# Patient Record
Sex: Female | Born: 1955 | Race: Black or African American | Hispanic: No | Marital: Single | Smoking: Never smoker
Health system: Southern US, Community
[De-identification: ages and names within clinical notes are randomized; demographics above are authoritative.]

## PROBLEM LIST (undated history)

## (undated) DIAGNOSIS — I10 Essential (primary) hypertension: Secondary | ICD-10-CM

## (undated) DIAGNOSIS — H269 Unspecified cataract: Secondary | ICD-10-CM

## (undated) HISTORY — PX: ABDOMINAL HYSTERECTOMY: SHX81

## (undated) HISTORY — DX: Essential (primary) hypertension: I10

## (undated) HISTORY — DX: Unspecified cataract: H26.9

---

## 1999-01-28 ENCOUNTER — Encounter: Payer: Self-pay | Admitting: Gastroenterology

## 1999-01-28 ENCOUNTER — Ambulatory Visit (HOSPITAL_COMMUNITY): Admission: RE | Admit: 1999-01-28 | Discharge: 1999-01-28 | Payer: Self-pay | Admitting: Gastroenterology

## 2001-06-02 ENCOUNTER — Other Ambulatory Visit: Admission: RE | Admit: 2001-06-02 | Discharge: 2001-06-02 | Payer: Self-pay | Admitting: Obstetrics and Gynecology

## 2003-08-22 ENCOUNTER — Other Ambulatory Visit: Admission: RE | Admit: 2003-08-22 | Discharge: 2003-08-22 | Payer: Self-pay | Admitting: Obstetrics and Gynecology

## 2005-01-06 ENCOUNTER — Ambulatory Visit: Payer: Self-pay | Admitting: Gastroenterology

## 2006-11-16 ENCOUNTER — Ambulatory Visit: Payer: Self-pay | Admitting: Gastroenterology

## 2006-11-30 ENCOUNTER — Ambulatory Visit: Payer: Self-pay | Admitting: Gastroenterology

## 2009-11-29 ENCOUNTER — Encounter: Admission: RE | Admit: 2009-11-29 | Discharge: 2009-11-29 | Payer: Self-pay | Admitting: Obstetrics and Gynecology

## 2010-10-12 IMAGING — US US SOFT TISSUE HEAD/NECK
1 series · 14 of 25 positions shown · non-contrast
Comparison: None.

CLINICAL DATA: Question of left thyroid nodule on physical exam

THYROID ULTRASOUND
TECHNIQUE: Ultrasound examination of the thyroid gland and
adjacent soft tissues was performed.

[Series 1: us soft tissue head/neck · 0.10mm/px · 14 of 39 slices shown]
[im 1/39]
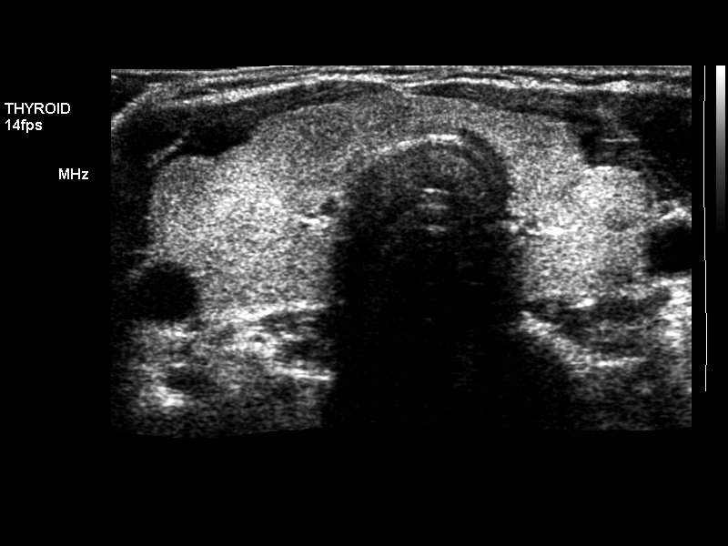
[im 4/39]
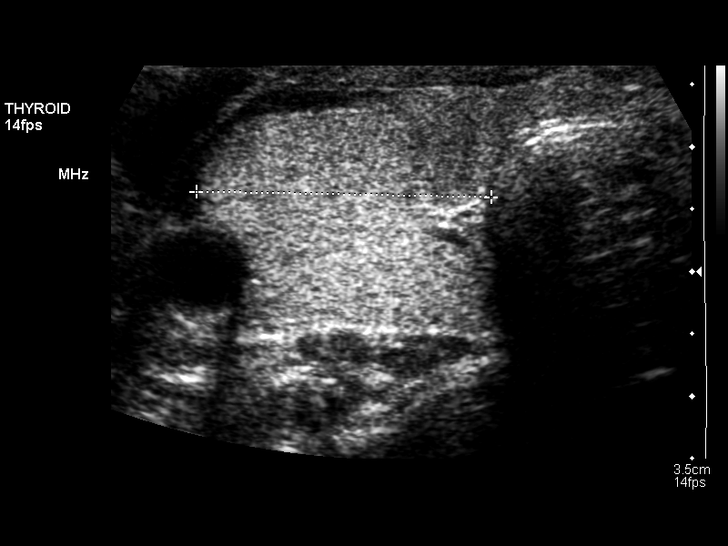
[im 7/39]
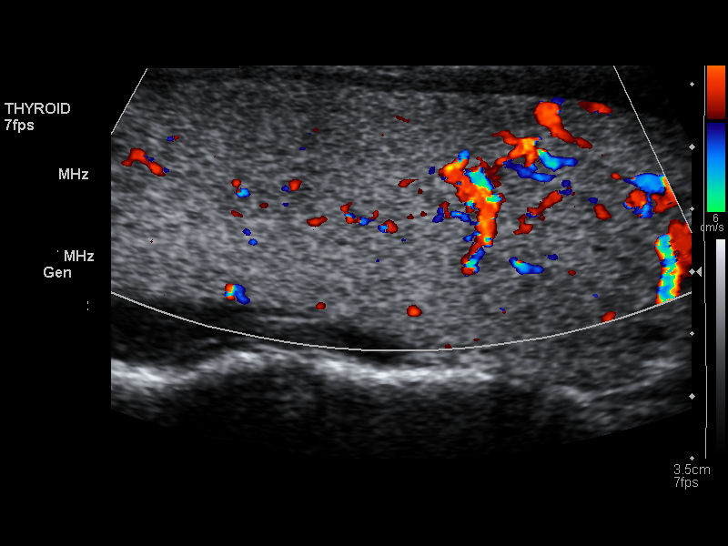
[im 10/39]
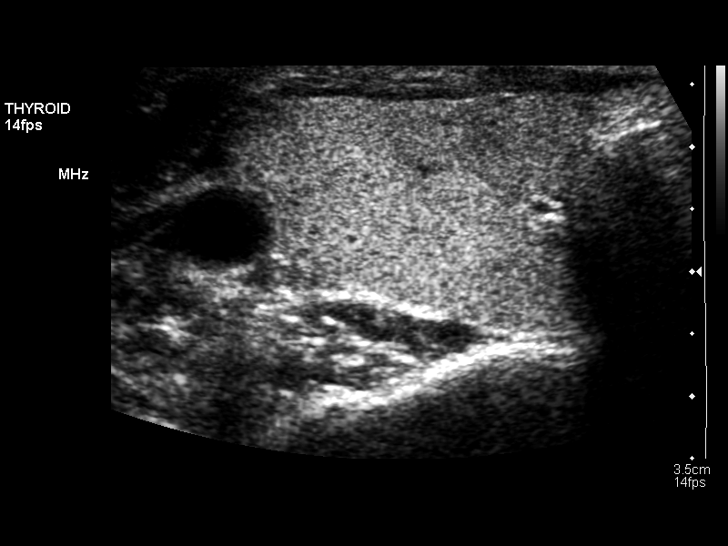
[im 13/39]
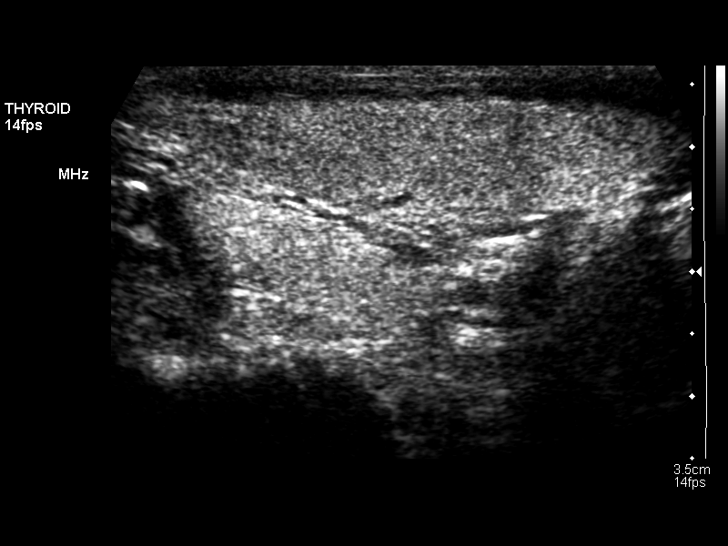
[im 15/39]
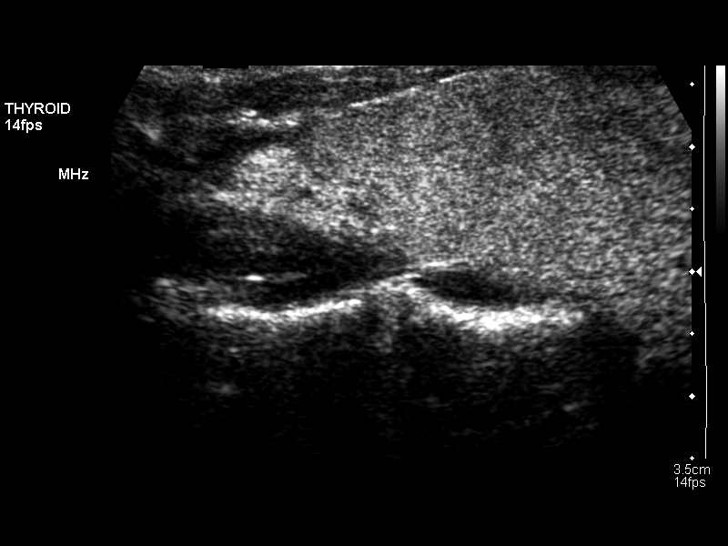
[im 18/39]
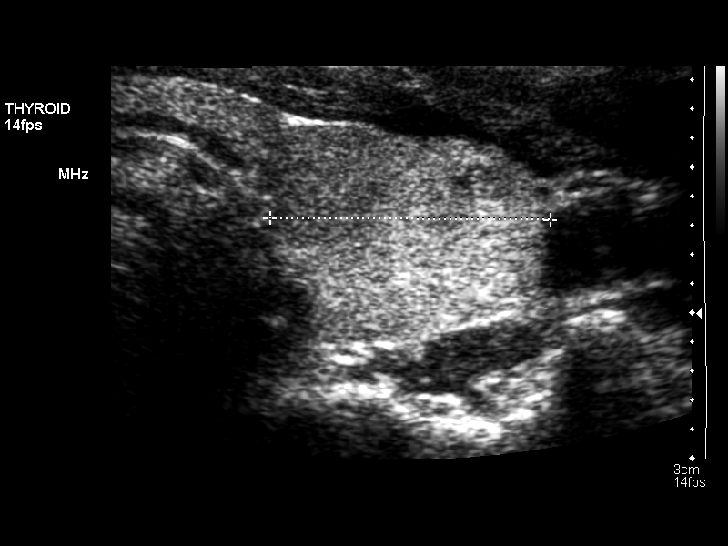
[im 21/39]
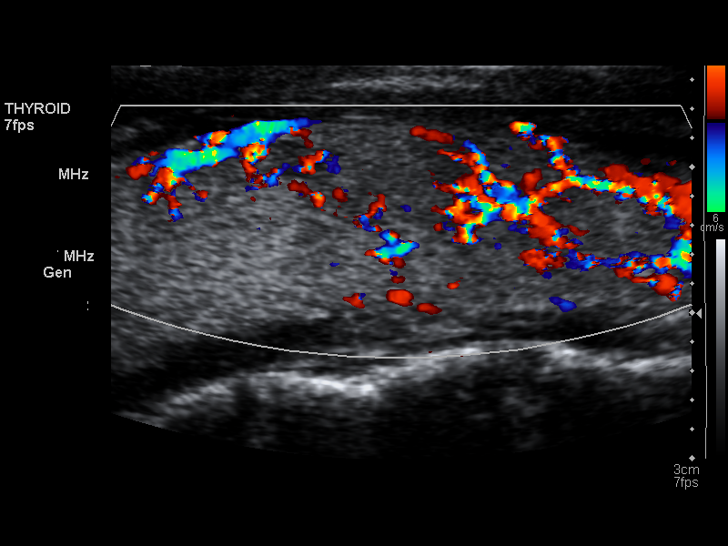
[im 24/39]
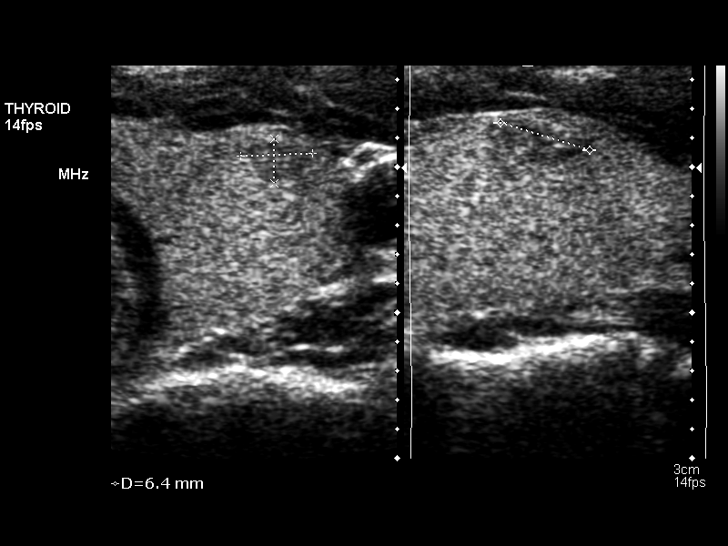
[im 26/39]
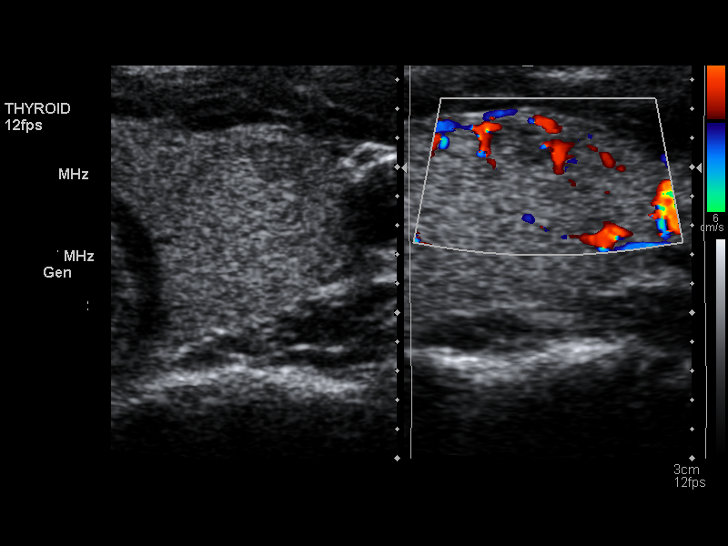
[im 29/39]
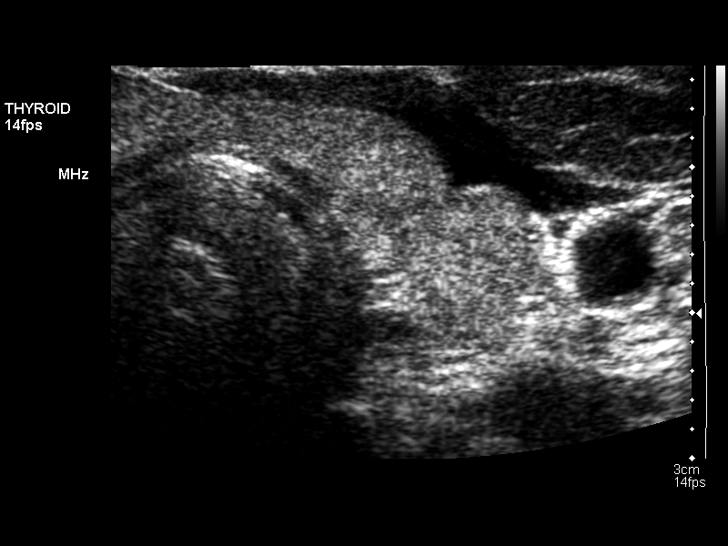
[im 32/39]
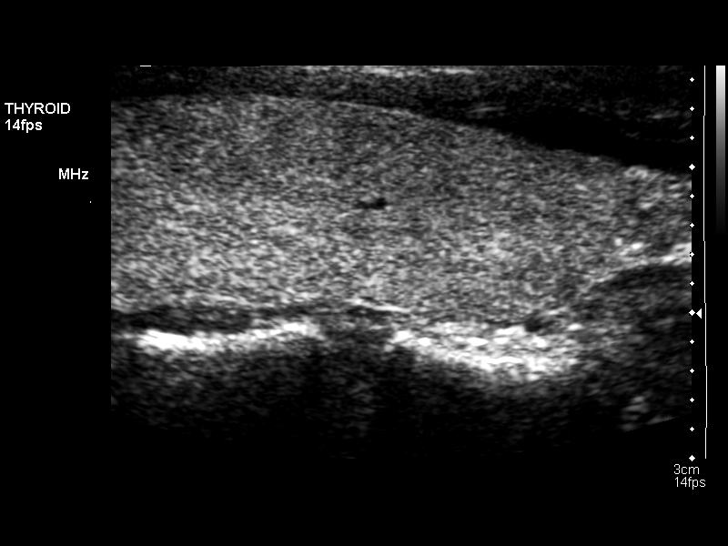
[im 35/39]
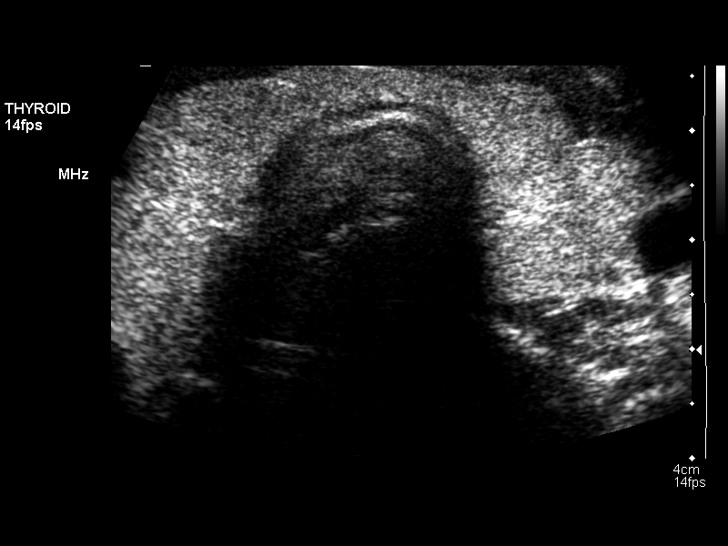
[im 39/39]
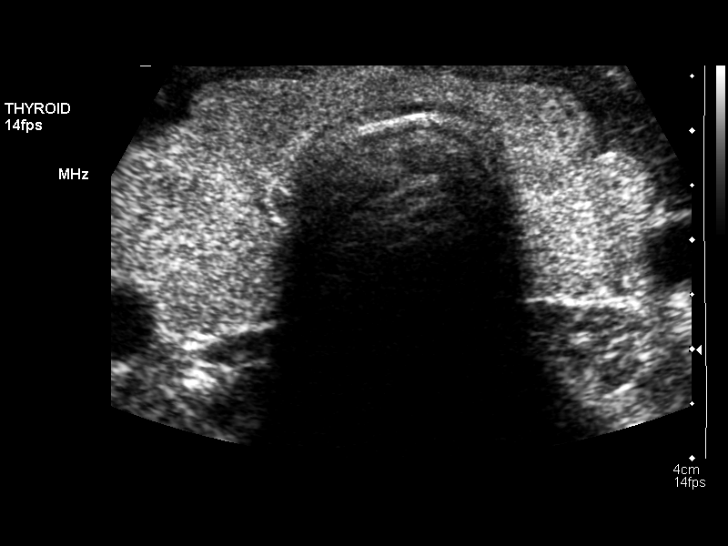

[14 of 25 positions shown; findings below may reference images not displayed]

FINDINGS: The thyroid gland is somewhat elongate and within upper
limits of normal in size.  The right lobe measures 6.8 cm
sagittally, with a depth of 1.9 cm and width of 2.4 cm.  The left
lobe measures 6.8 x 1.6 x 1.9 cm, with the isthmus measuring
mm.  The gland is only slightly inhomogeneous.  Only a single small
solid nodule is noted on the left of 5 x 3 x 6 mm.
IMPRESSION: The thyroid gland is slightly prominent and somewhat inhomogeneous.
Only a single solid nodule on the left is noted of no more than 6
mm in diameter.

## 2010-11-25 NOTE — Assessment & Plan Note (Signed)
[facility]                         GASTROENTEROLOGY OFFICE NOTE   Rebecca Pineda, Rebecca Pineda                    MRN:          [id number]  DATE:11/16/2006                            DOB:          1955/12/05    PROBLEM:  Excess gas.   REASON:  Rebecca Pineda has returned for reevaluation.  She complains of  excess flatus and eructations__________  post prandially.  She has also  had some left upper quadrant discomfort which is slightly relieved by a  bowel movement.  There has been no change of bowel habits, per se.  She  denies melena or hematochezia.  She also denies pyrosis and dysphagia.  She has a history of an esophageal stricture.   The only medication is Premarin.   PHYSICAL EXAMINATION:  Pulse 80, blood pressure 110/78, weight 136.  HEENT: EOMI. PERRLA. Sclerae are anicteric.  Conjunctivae are pink.  NECK:  Supple without thyromegaly, adenopathy or carotid bruits.  CHEST:  Clear to auscultation and percussion without adventitious  sounds.  CARDIAC:  Regular rhythm; normal S1 S2.  There are no murmurs, gallops  or rubs.  ABDOMEN:  Bowel sounds are normoactive.  Abdomen is soft, non-tender and  non-distended.  There are no abdominal masses, tenderness, splenic  enlargement or hepatomegaly.  EXTREMITIES:  Full range of motion.  No cyanosis, clubbing or edema.  RECTAL:  Deferred.   IMPRESSION:  Excess ____eructations______ and flatus.  This may be a  manifestation of gastroesophageal reflux disease.  At the same time, she  has had some left lower quadrant discomfort.  A partial obstruction  lesion of the colon is less likely, although ought to be ruled out.   RECOMMENDATION:  1. Trial of Nexium 40 mg daily.  2. Colonoscopy.     Barbette Hair. Arlyce Dice, MD,FACG  Electronically Signed    RDK/MedQ  DD: 11/16/2006  DT: 11/16/2006  Job #: [id number]   cc:   Nolon Nations, MD

## 2015-12-23 DIAGNOSIS — I519 Heart disease, unspecified: Secondary | ICD-10-CM | POA: Diagnosis not present

## 2015-12-23 DIAGNOSIS — E119 Type 2 diabetes mellitus without complications: Secondary | ICD-10-CM | POA: Diagnosis not present

## 2015-12-23 DIAGNOSIS — E559 Vitamin D deficiency, unspecified: Secondary | ICD-10-CM | POA: Diagnosis not present

## 2015-12-23 DIAGNOSIS — R946 Abnormal results of thyroid function studies: Secondary | ICD-10-CM | POA: Diagnosis not present

## 2016-01-27 DIAGNOSIS — Z6822 Body mass index (BMI) 22.0-22.9, adult: Secondary | ICD-10-CM | POA: Diagnosis not present

## 2016-01-27 DIAGNOSIS — Z01419 Encounter for gynecological examination (general) (routine) without abnormal findings: Secondary | ICD-10-CM | POA: Diagnosis not present

## 2016-02-06 DIAGNOSIS — Z1231 Encounter for screening mammogram for malignant neoplasm of breast: Secondary | ICD-10-CM | POA: Diagnosis not present

## 2016-09-17 DIAGNOSIS — E119 Type 2 diabetes mellitus without complications: Secondary | ICD-10-CM | POA: Diagnosis not present

## 2016-09-17 DIAGNOSIS — E559 Vitamin D deficiency, unspecified: Secondary | ICD-10-CM | POA: Diagnosis not present

## 2016-09-17 DIAGNOSIS — K2970 Gastritis, unspecified, without bleeding: Secondary | ICD-10-CM | POA: Diagnosis not present

## 2016-09-17 DIAGNOSIS — I519 Heart disease, unspecified: Secondary | ICD-10-CM | POA: Diagnosis not present

## 2016-10-08 ENCOUNTER — Encounter: Payer: Self-pay | Admitting: Gastroenterology

## 2016-11-03 DIAGNOSIS — I519 Heart disease, unspecified: Secondary | ICD-10-CM | POA: Diagnosis not present

## 2016-11-03 DIAGNOSIS — E559 Vitamin D deficiency, unspecified: Secondary | ICD-10-CM | POA: Diagnosis not present

## 2016-11-03 DIAGNOSIS — E119 Type 2 diabetes mellitus without complications: Secondary | ICD-10-CM | POA: Diagnosis not present

## 2016-11-03 DIAGNOSIS — Z01118 Encounter for examination of ears and hearing with other abnormal findings: Secondary | ICD-10-CM | POA: Diagnosis not present

## 2016-11-03 DIAGNOSIS — H538 Other visual disturbances: Secondary | ICD-10-CM | POA: Diagnosis not present

## 2016-11-03 DIAGNOSIS — Z136 Encounter for screening for cardiovascular disorders: Secondary | ICD-10-CM | POA: Diagnosis not present

## 2016-11-03 DIAGNOSIS — Z0000 Encounter for general adult medical examination without abnormal findings: Secondary | ICD-10-CM | POA: Diagnosis not present

## 2016-11-10 DIAGNOSIS — Z1211 Encounter for screening for malignant neoplasm of colon: Secondary | ICD-10-CM | POA: Diagnosis not present

## 2016-12-03 DIAGNOSIS — Z1211 Encounter for screening for malignant neoplasm of colon: Secondary | ICD-10-CM | POA: Diagnosis not present

## 2016-12-15 DIAGNOSIS — A048 Other specified bacterial intestinal infections: Secondary | ICD-10-CM | POA: Diagnosis not present

## 2016-12-15 DIAGNOSIS — K219 Gastro-esophageal reflux disease without esophagitis: Secondary | ICD-10-CM | POA: Diagnosis not present

## 2016-12-15 DIAGNOSIS — Z1211 Encounter for screening for malignant neoplasm of colon: Secondary | ICD-10-CM | POA: Diagnosis not present

## 2016-12-18 DIAGNOSIS — K5281 Eosinophilic gastritis or gastroenteritis: Secondary | ICD-10-CM | POA: Diagnosis not present

## 2016-12-18 DIAGNOSIS — K2970 Gastritis, unspecified, without bleeding: Secondary | ICD-10-CM | POA: Diagnosis not present

## 2016-12-18 DIAGNOSIS — K317 Polyp of stomach and duodenum: Secondary | ICD-10-CM | POA: Diagnosis not present

## 2016-12-18 DIAGNOSIS — K2270 Barrett's esophagus without dysplasia: Secondary | ICD-10-CM | POA: Diagnosis not present

## 2016-12-21 DIAGNOSIS — K209 Esophagitis, unspecified: Secondary | ICD-10-CM | POA: Diagnosis not present

## 2016-12-29 DIAGNOSIS — K219 Gastro-esophageal reflux disease without esophagitis: Secondary | ICD-10-CM | POA: Diagnosis not present

## 2017-02-02 DIAGNOSIS — E119 Type 2 diabetes mellitus without complications: Secondary | ICD-10-CM | POA: Diagnosis not present

## 2017-02-02 DIAGNOSIS — E559 Vitamin D deficiency, unspecified: Secondary | ICD-10-CM | POA: Diagnosis not present

## 2017-02-02 DIAGNOSIS — I519 Heart disease, unspecified: Secondary | ICD-10-CM | POA: Diagnosis not present

## 2017-02-12 DIAGNOSIS — Z6823 Body mass index (BMI) 23.0-23.9, adult: Secondary | ICD-10-CM | POA: Diagnosis not present

## 2017-02-12 DIAGNOSIS — Z01419 Encounter for gynecological examination (general) (routine) without abnormal findings: Secondary | ICD-10-CM | POA: Diagnosis not present

## 2017-02-12 DIAGNOSIS — Z124 Encounter for screening for malignant neoplasm of cervix: Secondary | ICD-10-CM | POA: Diagnosis not present

## 2017-02-12 DIAGNOSIS — Z1231 Encounter for screening mammogram for malignant neoplasm of breast: Secondary | ICD-10-CM | POA: Diagnosis not present

## 2017-05-03 DIAGNOSIS — E559 Vitamin D deficiency, unspecified: Secondary | ICD-10-CM | POA: Diagnosis not present

## 2017-05-03 DIAGNOSIS — I519 Heart disease, unspecified: Secondary | ICD-10-CM | POA: Diagnosis not present

## 2017-05-03 DIAGNOSIS — E119 Type 2 diabetes mellitus without complications: Secondary | ICD-10-CM | POA: Diagnosis not present

## 2017-05-03 DIAGNOSIS — Z2821 Immunization not carried out because of patient refusal: Secondary | ICD-10-CM | POA: Diagnosis not present

## 2017-09-20 DIAGNOSIS — K5900 Constipation, unspecified: Secondary | ICD-10-CM | POA: Diagnosis not present

## 2017-09-20 DIAGNOSIS — I519 Heart disease, unspecified: Secondary | ICD-10-CM | POA: Diagnosis not present

## 2017-09-20 DIAGNOSIS — E559 Vitamin D deficiency, unspecified: Secondary | ICD-10-CM | POA: Diagnosis not present

## 2017-09-20 DIAGNOSIS — E119 Type 2 diabetes mellitus without complications: Secondary | ICD-10-CM | POA: Diagnosis not present

## 2017-10-18 DIAGNOSIS — Z01118 Encounter for examination of ears and hearing with other abnormal findings: Secondary | ICD-10-CM | POA: Diagnosis not present

## 2017-10-18 DIAGNOSIS — E119 Type 2 diabetes mellitus without complications: Secondary | ICD-10-CM | POA: Diagnosis not present

## 2017-10-18 DIAGNOSIS — Z0000 Encounter for general adult medical examination without abnormal findings: Secondary | ICD-10-CM | POA: Diagnosis not present

## 2017-10-18 DIAGNOSIS — K5900 Constipation, unspecified: Secondary | ICD-10-CM | POA: Diagnosis not present

## 2017-10-18 DIAGNOSIS — E559 Vitamin D deficiency, unspecified: Secondary | ICD-10-CM | POA: Diagnosis not present

## 2017-10-18 DIAGNOSIS — I519 Heart disease, unspecified: Secondary | ICD-10-CM | POA: Diagnosis not present

## 2018-03-03 DIAGNOSIS — Z01419 Encounter for gynecological examination (general) (routine) without abnormal findings: Secondary | ICD-10-CM | POA: Diagnosis not present

## 2018-03-03 DIAGNOSIS — Z1231 Encounter for screening mammogram for malignant neoplasm of breast: Secondary | ICD-10-CM | POA: Diagnosis not present

## 2018-03-03 DIAGNOSIS — Z6824 Body mass index (BMI) 24.0-24.9, adult: Secondary | ICD-10-CM | POA: Diagnosis not present

## 2018-05-16 DIAGNOSIS — I519 Heart disease, unspecified: Secondary | ICD-10-CM | POA: Diagnosis not present

## 2018-05-16 DIAGNOSIS — E559 Vitamin D deficiency, unspecified: Secondary | ICD-10-CM | POA: Diagnosis not present

## 2018-05-16 DIAGNOSIS — K5900 Constipation, unspecified: Secondary | ICD-10-CM | POA: Diagnosis not present

## 2018-05-16 DIAGNOSIS — E119 Type 2 diabetes mellitus without complications: Secondary | ICD-10-CM | POA: Diagnosis not present

## 2018-06-14 DIAGNOSIS — I519 Heart disease, unspecified: Secondary | ICD-10-CM | POA: Diagnosis not present

## 2018-06-14 DIAGNOSIS — E119 Type 2 diabetes mellitus without complications: Secondary | ICD-10-CM | POA: Diagnosis not present

## 2018-06-14 DIAGNOSIS — E559 Vitamin D deficiency, unspecified: Secondary | ICD-10-CM | POA: Diagnosis not present

## 2018-06-14 DIAGNOSIS — K5900 Constipation, unspecified: Secondary | ICD-10-CM | POA: Diagnosis not present

## 2018-06-28 DIAGNOSIS — E119 Type 2 diabetes mellitus without complications: Secondary | ICD-10-CM | POA: Diagnosis not present

## 2018-06-28 DIAGNOSIS — E559 Vitamin D deficiency, unspecified: Secondary | ICD-10-CM | POA: Diagnosis not present

## 2018-06-28 DIAGNOSIS — K5900 Constipation, unspecified: Secondary | ICD-10-CM | POA: Diagnosis not present

## 2018-06-28 DIAGNOSIS — I519 Heart disease, unspecified: Secondary | ICD-10-CM | POA: Diagnosis not present

## 2019-06-30 DIAGNOSIS — Z01419 Encounter for gynecological examination (general) (routine) without abnormal findings: Secondary | ICD-10-CM | POA: Diagnosis not present

## 2019-06-30 DIAGNOSIS — Z6824 Body mass index (BMI) 24.0-24.9, adult: Secondary | ICD-10-CM | POA: Diagnosis not present

## 2019-06-30 DIAGNOSIS — Z1231 Encounter for screening mammogram for malignant neoplasm of breast: Secondary | ICD-10-CM | POA: Diagnosis not present

## 2019-12-18 ENCOUNTER — Other Ambulatory Visit: Payer: Self-pay | Admitting: Internal Medicine

## 2019-12-21 ENCOUNTER — Other Ambulatory Visit: Payer: Self-pay | Admitting: Internal Medicine

## 2019-12-21 DIAGNOSIS — E559 Vitamin D deficiency, unspecified: Secondary | ICD-10-CM

## 2019-12-21 DIAGNOSIS — E139 Other specified diabetes mellitus without complications: Secondary | ICD-10-CM

## 2020-01-16 ENCOUNTER — Ambulatory Visit
Admission: RE | Admit: 2020-01-16 | Discharge: 2020-01-16 | Disposition: A | Discharge status: Home / Self Care | Payer: 59 | Source: Ambulatory Visit | Attending: Internal Medicine | Admitting: Internal Medicine

## 2020-01-16 ENCOUNTER — Other Ambulatory Visit: Payer: Self-pay

## 2020-01-16 DIAGNOSIS — E559 Vitamin D deficiency, unspecified: Secondary | ICD-10-CM

## 2020-01-16 DIAGNOSIS — E139 Other specified diabetes mellitus without complications: Secondary | ICD-10-CM

## 2021-10-20 DIAGNOSIS — I10 Essential (primary) hypertension: Secondary | ICD-10-CM | POA: Diagnosis not present

## 2021-10-20 DIAGNOSIS — E559 Vitamin D deficiency, unspecified: Secondary | ICD-10-CM | POA: Diagnosis not present

## 2021-10-20 DIAGNOSIS — E119 Type 2 diabetes mellitus without complications: Secondary | ICD-10-CM | POA: Diagnosis not present

## 2021-10-20 DIAGNOSIS — K5904 Chronic idiopathic constipation: Secondary | ICD-10-CM | POA: Diagnosis not present

## 2021-10-20 DIAGNOSIS — E782 Mixed hyperlipidemia: Secondary | ICD-10-CM | POA: Diagnosis not present

## 2021-10-20 DIAGNOSIS — R1084 Generalized abdominal pain: Secondary | ICD-10-CM | POA: Diagnosis not present

## 2021-11-04 DIAGNOSIS — H524 Presbyopia: Secondary | ICD-10-CM | POA: Diagnosis not present

## 2021-11-04 DIAGNOSIS — H5203 Hypermetropia, bilateral: Secondary | ICD-10-CM | POA: Diagnosis not present

## 2021-11-04 DIAGNOSIS — H52203 Unspecified astigmatism, bilateral: Secondary | ICD-10-CM | POA: Diagnosis not present

## 2021-11-04 DIAGNOSIS — H2513 Age-related nuclear cataract, bilateral: Secondary | ICD-10-CM | POA: Diagnosis not present

## 2021-11-04 DIAGNOSIS — H47393 Other disorders of optic disc, bilateral: Secondary | ICD-10-CM | POA: Diagnosis not present

## 2021-11-04 DIAGNOSIS — H40003 Preglaucoma, unspecified, bilateral: Secondary | ICD-10-CM | POA: Diagnosis not present

## 2021-11-07 ENCOUNTER — Other Ambulatory Visit: Payer: Self-pay

## 2021-11-07 DIAGNOSIS — I10 Essential (primary) hypertension: Secondary | ICD-10-CM | POA: Insufficient documentation

## 2021-11-07 DIAGNOSIS — Z780 Asymptomatic menopausal state: Secondary | ICD-10-CM | POA: Insufficient documentation

## 2021-11-10 ENCOUNTER — Encounter: Payer: Self-pay | Admitting: Nurse Practitioner

## 2021-11-10 ENCOUNTER — Other Ambulatory Visit (INDEPENDENT_AMBULATORY_CARE_PROVIDER_SITE_OTHER): Payer: Medicare Other

## 2021-11-10 ENCOUNTER — Ambulatory Visit: Payer: Medicare Other | Admitting: Nurse Practitioner

## 2021-11-10 VITALS — BP 120/70 | HR 83 | Ht 64.0 in | Wt 136.5 lb

## 2021-11-10 DIAGNOSIS — R1032 Left lower quadrant pain: Secondary | ICD-10-CM

## 2021-11-10 DIAGNOSIS — K5900 Constipation, unspecified: Secondary | ICD-10-CM

## 2021-11-10 LAB — CBC WITH DIFFERENTIAL/PLATELET
Basophils Absolute: 0 10*3/uL (ref 0.0–0.1)
Basophils Relative: 1.2 % (ref 0.0–3.0)
Eosinophils Absolute: 0.1 10*3/uL (ref 0.0–0.7)
Eosinophils Relative: 1.8 % (ref 0.0–5.0)
HCT: 40.2 % (ref 36.0–46.0)
Hemoglobin: 13.1 g/dL (ref 12.0–15.0)
Lymphocytes Relative: 31.5 % (ref 12.0–46.0)
Lymphs Abs: 1.1 10*3/uL (ref 0.7–4.0)
MCHC: 32.7 g/dL (ref 30.0–36.0)
MCV: 85.4 fl (ref 78.0–100.0)
Monocytes Absolute: 0.2 10*3/uL (ref 0.1–1.0)
Monocytes Relative: 6.4 % (ref 3.0–12.0)
Neutro Abs: 2.1 10*3/uL (ref 1.4–7.7)
Neutrophils Relative %: 59.1 % (ref 43.0–77.0)
Platelets: 225 10*3/uL (ref 150.0–400.0)
RBC: 4.7 Mil/uL (ref 3.87–5.11)
RDW: 14.5 % (ref 11.5–15.5)
WBC: 3.5 10*3/uL — ABNORMAL LOW (ref 4.0–10.5)

## 2021-11-10 LAB — COMPREHENSIVE METABOLIC PANEL
ALT: 11 U/L (ref 0–35)
AST: 15 U/L (ref 0–37)
Albumin: 4.5 g/dL (ref 3.5–5.2)
Alkaline Phosphatase: 66 U/L (ref 39–117)
BUN: 11 mg/dL (ref 6–23)
CO2: 29 mEq/L (ref 19–32)
Calcium: 9.8 mg/dL (ref 8.4–10.5)
Chloride: 106 mEq/L (ref 96–112)
Creatinine, Ser: 0.94 mg/dL (ref 0.40–1.20)
GFR: 63.9 mL/min (ref >60.00)
Glucose, Bld: 110 mg/dL — ABNORMAL HIGH (ref 70–99)
Potassium: 4 mEq/L (ref 3.5–5.1)
Sodium: 142 mEq/L (ref 135–145)
Total Bilirubin: 0.4 mg/dL (ref 0.2–1.2)
Total Protein: 7.4 g/dL (ref 6.0–8.3)

## 2021-11-10 LAB — C-REACTIVE PROTEIN: CRP: <1 mg/dL (ref 0.5–20.0)

## 2021-11-10 NOTE — Progress Notes (Signed)
? ? ? ?11/10/2021 ?Rebecca Pineda ?[id number] ?Sep 26, 1955 ? ? ?CHIEF COMPLAINT: LLQ pain  ? ?HISTORY OF PRESENT ILLNESS:  Rebecca Pineda is a 66 year old female with a past medical history of hypertension. Past total hysterectomy in the 1980's. She presents to our office today as referred by Dr. Julio Sicks  for further evaluation regarding constipation and abdominal pain. She is accompanied by her daughter.  She complains of having intermittent LLQ pain for the past 2 years and is worse when constipated.  She typically passes a formed brown bowel movement 3 days weekly.  When she feels significantly constipated, she takes a fiber supplement and drinks prune juice which results in passing a bowel movement daily for a week or so and she gradually goes back to her bowel pattern of a BM every other day.  No rectal bleeding or black stools.  She denies ever being diagnosed with diverticulitis.  She sometimes feels a knot to the LLQ area.  She denies having any LLQ pain today but endorses having a vague level of some discomfort to this area which never really goes away.  Her LLQ pain is more noticeable when she travels in a car for several hours, however, her abdominal pain does not worsen driving over bumps in the road.  He drinks less than 8 glasses of water daily.  She eats a fairly low fiber diet.  She underwent 2 colonoscopies by a gastroenterologist in [facility] which she reported were normal, no polyps.  Her most recent colonoscopy was less than 5 years ago.  She cannot recall the name of the gastroenterologist in [place].  She wishes to transition her GI management to [facility] gastro[facility]ology.  She was previously seen by Dr. Arlyce Dice and underwent a colonoscopy 11/30/2006 which was normal.  No known family history of esophageal, gastric or colon cancer. ? ?Social History: She is married.  She has 2 daughters.  She is retired.  Non-smoker.  No alcohol use.  No drug use. ? ?Family History: Father died from lung  cancer age 70 or 23. Mother deceased in her 2s with history of a CVA and dementia. No family history esophageal, gastric or colon cancer.  ?  ?Outpatient Encounter Medications as of 11/10/2021  ?Medication Sig  ? docusate sodium (COLACE) 100 MG capsule Take 100 mg by mouth 2 (two) times daily.  ? hydrochlorothiazide (HYDRODIURIL) 12.5 MG tablet 12.5 mg daily.  ? ?No facility-administered encounter medications on file as of 11/10/2021.  ? ? ?REVIEW OF SYSTEMS:  ?Gen: Denies fever, sweats or chills. No weight loss.  ?CV: Denies chest pain, palpitations or edema. ?Resp: Denies cough, shortness of breath of hemoptysis.  ?GI: See HPI.  No GERD symptoms.   ?GU : Denies urinary burning, blood in urine, increased urinary frequency or incontinence. ?MS: Denies joint pain, muscles aches or weakness. ?Derm: Denies rash, itchiness, skin lesions or unhealing ulcers. ?Psych: Denies depression, anxiety or memory loss. ?Heme: Denies bruising, easy bleeding. ?Neuro:  Denies headaches, dizziness or paresthesias. ?Endo:  Denies any problems with DM, thyroid or adrenal function. ? ?PHYSICAL EXAM: ?BP 120/70   Pulse 83   Ht 5\' 4"  (1.626 m) Comment: height measured without shoes  Wt 136 lb 8 oz (61.9 kg)   BMI 23.43 kg/m?  ? ?General: 66 year old female in no acute distress. ?Head: Normocephalic and atraumatic. ?Eyes:  Sclerae non-icteric, conjunctive pink. ?Ears: Normal auditory acuity. ?Mouth: Dentition intact. No ulcers or lesions.  ?Neck: Supple, no lymphadenopathy or  thyromegaly.  ?Lungs: Clear bilaterally to auscultation without wheezes, crackles or rhonchi. Keyloid scar upper sternum.  ?Heart: Regular rate and rhythm. No murmur, rub or gallop appreciated.  ?Abdomen: Soft, non distended. Mild tenderness to the LUQ, epigastric, and throughout the lower abdomen without rebound or guarding. Negative shake tenderness. No masses. No hepatosplenomegaly. Normoactive bowel sounds x 4 quadrants. Lower midline vertical scar intact.   ?Rectal: Deferred.  ?Musculoskeletal: Symmetrical with no gross deformities. ?Skin: Warm and dry. No rash or lesions on visible extremities. ?Extremities: No edema. ?Neurological: Alert oriented x 4, no focal deficits.  ?Psychological:  Alert and cooperative. Normal mood and affect. ? ?ASSESSMENT AND PLAN: ? ?8) 66 year old female with chronic LLQ pain.  She underwent 2 colonoscopies over the past 10 years which she reported were normal.  Mild tenderness to the LUQ, epigastric and throughout the lower abdomen without rebound or guarding on exam. ?-CBC, CMP and CRP ?-CTAP with contrast ?-Request copies of two colonoscopies done by GI in [facility], will check if PCP has these records  ?-IBgard 1 p.o. twice daily for abdominal pain/gas, samples provided ?-See plan in #2 ? ?2) Constipation ?-MiraLAX nightly as tolerated ?-Increase water intake to 8 glasses (64 ounces) daily  ? ?Patient to follow-up with Dr. Tomasa Rand in 6 weeks  ? ? ? ? ?CC:  No ref. provider found ? ? ? ?

## 2021-11-10 NOTE — Patient Instructions (Addendum)
You will be contacted by [facility] (Your caller ID will indicate phone # [phone]) in the next 7 days to schedule your CT Scan. If you have not heard from them within 7 business days, please call [facility] at [phone] to follow up on the status of your appointment.   ? ?Please proceed to the basement level for lab work before leaving today. Press "B" on the elevator. The lab is located at the first door on the left as you exit the elevator. ? ?Miralax- Dissolve one capful in 8 ounces of water and drink before bed. ?IBgard 1 capsule twice a day as needed for abdominal pain/gas. ? ?We have scheduled you a follow up with Dr. Candis Schatz on 12/26/21 at 8:30 am. ? ?Thank you for trusting me with your gastrointestinal care!   ? ?Noralyn Pick, CRNP ? ? ? ?BMI: ? ?If you are age 65 or older, your body mass index should be between 23-30. Your Body mass index is 23.43 kg/m?Marland Kitchen If this is out of the aforementioned range listed, please consider follow up with your Primary Care Provider. ? ?If you are age 54 or younger, your body mass index should be between 19-25. Your Body mass index is 23.43 kg/m?Marland Kitchen If this is out of the aformentioned range listed, please consider follow up with your Primary Care Provider.  ? ?MY CHART: ? ?The [facility] providers would like to encourage you to use [facility] to communicate with providers for non-urgent requests or questions.  Due to long hold times on the telephone, sending your provider a message by [facility] may be a faster and more efficient way to get a response.  Please allow 48 business hours for a response.  Please remember that this is for non-urgent requests.  ? ?

## 2021-11-13 NOTE — Progress Notes (Signed)
Agree with the assessment and plan as outlined by Colleen Kennedy-Smith, NP.    Elona Yinger E. Lenton Gendreau, MD [facility]  

## 2021-11-14 ENCOUNTER — Telehealth: Payer: Self-pay | Admitting: Nurse Practitioner

## 2021-11-14 NOTE — Telephone Encounter (Signed)
Spoke to patient, let her know to drink 1 bottle 2 hours before and the other bottle 1 hour before. ?Patient expressed understanding and agreement. ?

## 2021-11-14 NOTE — Telephone Encounter (Signed)
Patient called regarding solution she was given for imaging. Per patient, she has questions. Please advise. ?

## 2021-11-17 ENCOUNTER — Ambulatory Visit (HOSPITAL_BASED_OUTPATIENT_CLINIC_OR_DEPARTMENT_OTHER): Payer: Medicare Other

## 2021-11-17 DIAGNOSIS — H52203 Unspecified astigmatism, bilateral: Secondary | ICD-10-CM | POA: Diagnosis not present

## 2021-11-17 DIAGNOSIS — H35033 Hypertensive retinopathy, bilateral: Secondary | ICD-10-CM | POA: Diagnosis not present

## 2021-11-17 DIAGNOSIS — H524 Presbyopia: Secondary | ICD-10-CM | POA: Diagnosis not present

## 2021-11-17 DIAGNOSIS — H2511 Age-related nuclear cataract, right eye: Secondary | ICD-10-CM | POA: Insufficient documentation

## 2021-11-17 DIAGNOSIS — H25013 Cortical age-related cataract, bilateral: Secondary | ICD-10-CM | POA: Diagnosis not present

## 2021-11-17 DIAGNOSIS — H5371 Glare sensitivity: Secondary | ICD-10-CM | POA: Diagnosis not present

## 2021-11-17 DIAGNOSIS — H2513 Age-related nuclear cataract, bilateral: Secondary | ICD-10-CM | POA: Diagnosis not present

## 2021-11-17 DIAGNOSIS — H40003 Preglaucoma, unspecified, bilateral: Secondary | ICD-10-CM | POA: Diagnosis not present

## 2021-11-17 DIAGNOSIS — H5203 Hypermetropia, bilateral: Secondary | ICD-10-CM | POA: Diagnosis not present

## 2021-11-19 ENCOUNTER — Encounter (HOSPITAL_BASED_OUTPATIENT_CLINIC_OR_DEPARTMENT_OTHER): Payer: Self-pay

## 2021-11-19 ENCOUNTER — Ambulatory Visit (HOSPITAL_BASED_OUTPATIENT_CLINIC_OR_DEPARTMENT_OTHER)
Admission: RE | Admit: 2021-11-19 | Discharge: 2021-11-19 | Disposition: A | Discharge status: Home / Self Care | Payer: Medicare Other | Source: Ambulatory Visit | Attending: Nurse Practitioner | Admitting: Nurse Practitioner

## 2021-11-19 DIAGNOSIS — R109 Unspecified abdominal pain: Secondary | ICD-10-CM | POA: Diagnosis not present

## 2021-11-19 DIAGNOSIS — R1032 Left lower quadrant pain: Secondary | ICD-10-CM | POA: Diagnosis not present

## 2021-11-19 DIAGNOSIS — K5900 Constipation, unspecified: Secondary | ICD-10-CM | POA: Insufficient documentation

## 2021-11-19 MED ORDER — IOHEXOL 300 MG/ML  SOLN
100.0000 mL | Freq: Once | INTRAMUSCULAR | Status: AC | PRN
  Administered 2021-11-19: 100 mL via INTRAVENOUS

## 2021-11-21 ENCOUNTER — Telehealth: Payer: Self-pay | Admitting: Nurse Practitioner

## 2021-11-21 NOTE — Telephone Encounter (Signed)
Results have not been reviewed by provider. Once reviewed and recommendations have been provided, pt will receive a call. ?

## 2021-11-21 NOTE — Telephone Encounter (Signed)
We received a call from patient requesting to speak with nurse regarding imaging results. Please advise. ?

## 2021-11-25 NOTE — Telephone Encounter (Signed)
We received a call from patient seeking advise. Per patient, still experiencing the same symptoms as her last OV, what are the next steps? Please advise. ?

## 2021-11-25 NOTE — Telephone Encounter (Signed)
Returned pt call to inquire further and to ensure pt is following previous recommendations. LVM requesting returned call. ? ?CT did not explain reason for abd pain. Per OV notes,  ? ?-IBgard 1 p.o. twice daily for abdominal pain/gas, samples provided ?  ?2) Constipation ?-MiraLAX nightly as tolerated ?-Increase water intake to 8 glasses (64 ounces) daily  ?

## 2021-11-26 DIAGNOSIS — E782 Mixed hyperlipidemia: Secondary | ICD-10-CM | POA: Diagnosis not present

## 2021-11-26 DIAGNOSIS — E559 Vitamin D deficiency, unspecified: Secondary | ICD-10-CM | POA: Diagnosis not present

## 2021-11-26 DIAGNOSIS — K5904 Chronic idiopathic constipation: Secondary | ICD-10-CM | POA: Diagnosis not present

## 2021-11-26 DIAGNOSIS — E119 Type 2 diabetes mellitus without complications: Secondary | ICD-10-CM | POA: Diagnosis not present

## 2021-11-26 DIAGNOSIS — R1084 Generalized abdominal pain: Secondary | ICD-10-CM | POA: Diagnosis not present

## 2021-11-26 DIAGNOSIS — I10 Essential (primary) hypertension: Secondary | ICD-10-CM | POA: Diagnosis not present

## 2021-11-26 NOTE — Telephone Encounter (Signed)
SECOND ATTEMPT: ° °LVM requesting returned call. °

## 2021-11-27 NOTE — Telephone Encounter (Signed)
FINAL ATTEMPT:  LVM requesting returned call 

## 2021-11-27 NOTE — Telephone Encounter (Signed)
Patient notified of the results and recommendations She is aware of the follow up appointment with Dr. Tomasa Rand for 12/26/21 at 8:30.  She will call back for any additional questions or concerns.

## 2021-12-26 ENCOUNTER — Encounter: Payer: Self-pay | Admitting: Gastroenterology

## 2021-12-26 ENCOUNTER — Ambulatory Visit: Payer: Medicare Other | Admitting: Gastroenterology

## 2021-12-26 VITALS — BP 128/76 | HR 82 | Ht 64.0 in | Wt 135.0 lb

## 2021-12-26 DIAGNOSIS — K5900 Constipation, unspecified: Secondary | ICD-10-CM

## 2021-12-26 DIAGNOSIS — R1013 Epigastric pain: Secondary | ICD-10-CM | POA: Diagnosis not present

## 2021-12-26 NOTE — Patient Instructions (Addendum)
If you are age 65 or older, your body mass index should be between 23-30. Your Body mass index is 23.17 kg/m. If this is out of the aforementioned range listed, please consider follow up with your Primary Care Provider.  If you are age 61 or younger, your body mass index should be between 19-25. Your Body mass index is 23.17 kg/m. If this is out of the aformentioned range listed, please consider follow up with your Primary Care Provider.   You have been scheduled for an endoscopy. Please follow written instructions given to you at your visit today. If you use inhalers (even only as needed), please bring them with you on the day of your procedure.  Please purchase Metamucil over the counter. Take as directed.    The [facility] providers would like to encourage you to use [facility] to communicate with providers for non-urgent requests or questions.  Due to long hold times on the telephone, sending your provider a message by [facility] may be a faster and more efficient way to get a response.  Please allow 48 business hours for a response.  Please remember that this is for non-urgent requests.   It was a pleasure to see you today!  Thank you for trusting me with your gastrointestinal care!    Scott E.Tomasa Rand, MD

## 2021-12-26 NOTE — Progress Notes (Signed)
HPI : Rebecca Pineda is a very pleasant 66 year old female with a history of hypertension who was originally seen in our office by Alcide Evener on May 1 of this year for further evaluation of constipation and left lower quadrant abdominal pain.  CT of the abdomen and pelvis was unremarkable.  Labs, including CBC, CMP.  She was recommended to take MiraLax nightly, increase water consumption and take IBGard as needed for pain. The IBgard did not seem to help very much for the pain.  She was then prescribed Bentyl 20 mg as needed by her primary care provider and this has helped improve her pain.  She has been taking it 3 times a day on average for the past several weeks.  She describes the pain as a dull pain along her left side which is typically aggravated by certain movements and positions.  The pain does not seem to be related to eating.  It is not improved with bowel movements.  It typically last less than a minute at a time.  She has not tried taking any other medications for the pain besides the Bentyl. She never did try the MiraLAX.  She has been having bowel movements about every other day.  Her stools are typically formed and solid, and she denies hard stools or straining.   Past Medical History:  Diagnosis Date   HTN (hypertension)    Colonoscopy  2008: (Dr. Arlyce Dice):  Normal  Past Surgical History:  Procedure Laterality Date   ABDOMINAL HYSTERECTOMY     CESAREAN SECTION     Family History  Problem Relation Age of Onset   Hypertension Mother    Stroke Mother    Stroke Father    Lung cancer Father    Diabetes Sister    Heart disease Nephew    Social History   Tobacco Use   Smoking status: Never   Smokeless tobacco: Never  Vaping Use   Vaping Use: Never used  Substance Use Topics   Alcohol use: Yes    Comment: occasional wine   Current Outpatient Medications  Medication Sig Dispense Refill   AMBULATORY NON FORMULARY MEDICATION Cholesteroff 1 tablet once daily      AMBULATORY NON FORMULARY MEDICATION Cinnamon supplement 1 tablet by mouth daily     Cholecalciferol (VITAMIN D3 PO) Take 1 capsule by mouth daily.     hydrochlorothiazide (HYDRODIURIL) 12.5 MG tablet 12.5 mg daily.     No current facility-administered medications for this visit.   No Known Allergies   Review of Systems: All systems reviewed and negative except where noted in HPI.    No results found.  Physical Exam: BP 128/76   Pulse 82   Ht 5\' 4"  (1.626 m)   Wt 135 lb (61.2 kg)   SpO2 99%   BMI 23.17 kg/m  Constitutional: Pleasant,well-developed, African American female in no acute distress.  Accompanied by husband HEENT: Normocephalic and atraumatic. Conjunctivae are normal. No scleral icterus. Neck supple.  Cardiovascular: Normal rate, regular rhythm.  Pulmonary/chest: Effort normal and breath sounds normal. No wheezing, rales or rhonchi. Abdominal: Soft, nondistended, tenderness to palpation in the epigastrium, right upper quadrant and right lower quadrant, most notably in the epigastrium.  Carnett's sign negative.  Bowel sounds active throughout. There are no masses palpable. No hepatomegaly. Extremities: no edema Neurological: Alert and oriented to person place and time. Skin: Skin is warm and dry. No rashes noted. Psychiatric: Normal mood and affect. Behavior is normal.  CBC    Component  Value Date/Time   WBC 3.5 (L) 11/10/2021 1039   RBC 4.70 11/10/2021 1039   HGB 13.1 11/10/2021 1039   HCT 40.2 11/10/2021 1039   PLT 225.0 11/10/2021 1039   MCV 85.4 11/10/2021 1039   MCHC 32.7 11/10/2021 1039   RDW 14.5 11/10/2021 1039   LYMPHSABS 1.1 11/10/2021 1039   MONOABS 0.2 11/10/2021 1039   EOSABS 0.1 11/10/2021 1039   BASOSABS 0.0 11/10/2021 1039    CMP     Component Value Date/Time   NA 142 11/10/2021 1039   K 4.0 11/10/2021 1039   CL 106 11/10/2021 1039   CO2 29 11/10/2021 1039   GLUCOSE 110 (H) 11/10/2021 1039   BUN 11 11/10/2021 1039   CREATININE  0.94 11/10/2021 1039   CALCIUM 9.8 11/10/2021 1039   PROT 7.4 11/10/2021 1039   ALBUMIN 4.5 11/10/2021 1039   AST 15 11/10/2021 1039   ALT 11 11/10/2021 1039   ALKPHOS 66 11/10/2021 1039   BILITOT 0.4 11/10/2021 1039     ASSESSMENT AND PLAN: 66 year old female with persistent bothersome abdominal pain and chronic constipation.  Her description of the pain seems more consistent with a musculoskeletal etiology, and that it is exacerbated and improved with certain positions and movements, and does not seem to have relationship with eating or bowel habits.  Carnett's sign was negative and more suggestive of a visceral source of pain, with maximal tenderness in the epigastrium. We did not receive any records from [facility] regarding her previous colonoscopies.  We will request records again to determine exactly when she would be due for her next colonoscopy.  She has never had an upper endoscopy.  Because of her tenderness to palpation in the epigastrium, and her persistent pain, I think an upper endoscopy to rule out etiologies such as peptic ulcer disease and malignancy is reasonable. The patient would prefer to have natural or homeopathic remedies for her symptoms.  I suggested she try psyllium to improve her stool bulk and consistency, as well as incorporating kiwi fruit and prunes into her diet.  I also suggested a low FODMAP diet   Abdominal pain - EGD - Continue Bentyl - low FODMAP diet handout  Constipation - Metamucil daily  The details, risks (including bleeding, perforation, infection, missed lesions, medication reactions and possible hospitalization or surgery if complications occur), benefits, and alternatives to EGD with possible biopsy and possible dilation were discussed with the patient and she consents to proceed.   Rebecca Pineda E. Tomasa Rand, MD [facility]   Jackie Plum, MD

## 2021-12-30 ENCOUNTER — Encounter: Payer: Medicare Other | Admitting: Gastroenterology

## 2022-01-08 DIAGNOSIS — H25811 Combined forms of age-related cataract, right eye: Secondary | ICD-10-CM | POA: Diagnosis not present

## 2022-01-08 DIAGNOSIS — I10 Essential (primary) hypertension: Secondary | ICD-10-CM | POA: Diagnosis not present

## 2022-01-08 DIAGNOSIS — H2511 Age-related nuclear cataract, right eye: Secondary | ICD-10-CM | POA: Diagnosis not present

## 2022-01-14 ENCOUNTER — Ambulatory Visit (AMBULATORY_SURGERY_CENTER): Payer: Medicare Other | Admitting: Gastroenterology

## 2022-01-14 ENCOUNTER — Encounter: Payer: Self-pay | Admitting: Gastroenterology

## 2022-01-14 VITALS — BP 122/64 | HR 80 | Temp 97.4°F | Resp 16 | Ht 64.0 in | Wt 135.0 lb

## 2022-01-14 DIAGNOSIS — R1013 Epigastric pain: Secondary | ICD-10-CM

## 2022-01-14 DIAGNOSIS — K2970 Gastritis, unspecified, without bleeding: Secondary | ICD-10-CM | POA: Diagnosis not present

## 2022-01-14 DIAGNOSIS — K2950 Unspecified chronic gastritis without bleeding: Secondary | ICD-10-CM | POA: Diagnosis not present

## 2022-01-14 MED ORDER — SODIUM CHLORIDE 0.9 % IV SOLN: 500.0000 mL | Freq: Once | INTRAVENOUS | Status: DC

## 2022-01-14 NOTE — Patient Instructions (Signed)
Resume previous diet.  Continue present medications.  Await pathology results.   YOU HAD AN ENDOSCOPIC PROCEDURE TODAY AT THE [facility]:   Refer to the procedure report that was given to you for any specific questions about what was found during the examination.  If the procedure report does not answer your questions, please call your gastroenterologist to clarify.  If you requested that your care partner not be given the details of your procedure findings, then the procedure report has been included in a sealed envelope for you to review at your convenience later.  YOU SHOULD EXPECT: Some feelings of bloating in the abdomen. Passage of more gas than usual.  Walking can help get rid of the air that was put into your GI tract during the procedure and reduce the bloating. If you had a lower endoscopy (such as a colonoscopy or flexible sigmoidoscopy) you may notice spotting of blood in your stool or on the toilet paper. If you underwent a bowel prep for your procedure, you may not have a normal bowel movement for a few days.  Please Note:  You might notice some irritation and congestion in your nose or some drainage.  This is from the oxygen used during your procedure.  There is no need for concern and it should clear up in a day or so.  SYMPTOMS TO REPORT IMMEDIATELY:   Following upper endoscopy (EGD)  Vomiting of blood or coffee ground material  New chest pain or pain under the shoulder blades  Painful or persistently difficult swallowing  New shortness of breath  Fever of 100F or higher  Black, tarry-looking stools  For urgent or emergent issues, a gastroenterologist can be reached at any hour by calling [phone]. Do not use MyChart messaging for urgent concerns.    DIET:  We do recommend a small meal at first, but then you may proceed to your regular diet.  Drink plenty of fluids but you should avoid alcoholic beverages for 24 hours.  ACTIVITY:  You should plan to  take it easy for the rest of today and you should NOT DRIVE or use heavy machinery until tomorrow (because of the sedation medicines used during the test).    FOLLOW UP: Our staff will call the number listed on your records the next business day following your procedure.  We will call around 7:15- 8:00 am to check on you and address any questions or concerns that you may have regarding the information given to you following your procedure. If we do not reach you, we will leave a message.  If you develop any symptoms (ie: fever, flu-like symptoms, shortness of breath, cough etc.) before then, please call [phone].  If you test positive for Covid 19 in the 2 weeks post procedure, please call and report this information to us.    If any biopsies were taken you will be contacted by phone or by letter within the next 1-3 weeks.  Please call us at [phone] if you have not heard about the biopsies in 3 weeks.    SIGNATURES/CONFIDENTIALITY: You and/or your care partner have signed paperwork which will be entered into your electronic medical record.  These signatures attest to the fact that that the information above on your After Visit Summary has been reviewed and is understood.  Full responsibility of the confidentiality of this discharge information lies with you and/or your care-partner.  

## 2022-01-14 NOTE — Progress Notes (Signed)
History and Physical Interval Note:  01/14/2022 1:58 PM  Rebecca Pineda  has presented today for endoscopic procedure(s), with the diagnosis of  Encounter Diagnosis  Name Primary?   Epigastric pain Yes  .  The various methods of evaluation and treatment have been discussed with the patient and/or family. After consideration of risks, benefits and other options for treatment, the patient has consented to  the endoscopic procedure(s).   The patient's history has been reviewed, patient examined, no change in status, stable for endoscopic procedure(s).  I have reviewed the patient's chart and labs.  Questions were answered to the patient's satisfaction.     Shaynah Hund E. Tomasa Rand, MD [facility]

## 2022-01-14 NOTE — Progress Notes (Signed)
A and O x3. Report to RN. Tolerated MAC anesthesia well.Teeth unchanged after procedure. 

## 2022-01-14 NOTE — Progress Notes (Signed)
Called to room to assist during endoscopic procedure.  Patient ID and intended procedure confirmed with present staff. Received instructions for my participation in the procedure from the performing physician.  

## 2022-01-14 NOTE — Op Note (Signed)
[facility] Patient Name: Rebecca Pineda Procedure Date: 01/14/2022 1:58 PM MRN: [id number] Endoscopist: Lorin Picket E. Tomasa Rand , MD Age: 66 Referring MD:  Date of Birth: 1955-12-15 Gender: Female Account #: [id number] Procedure:                Upper GI endoscopy Indications:              Epigastric abdominal pain Medicines:                Monitored Anesthesia Care Procedure:                Pre-Anesthesia Assessment:                           - Prior to the procedure, a History and Physical                            was performed, and patient medications and                            allergies were reviewed. The patient's tolerance of                            previous anesthesia was also reviewed. The risks                            and benefits of the procedure and the sedation                            options and risks were discussed with the patient.                            All questions were answered, and informed consent                            was obtained. Prior Anticoagulants: The patient has                            taken no previous anticoagulant or antiplatelet                            agents. ASA Grade Assessment: II - A patient with                            mild systemic disease. After reviewing the risks                            and benefits, the patient was deemed in                            satisfactory condition to undergo the procedure.                           After obtaining informed consent, the endoscope was  passed under direct vision. Throughout the                            procedure, the patient's blood pressure, pulse, and                            oxygen saturations were monitored continuously. The                            GIF [id number] #[id number] was introduced through the                            mouth, and advanced to the third part of duodenum.                            The upper GI endoscopy was  accomplished without                            difficulty. The patient tolerated the procedure                            well. Scope In: Scope Out: Findings:                 The examined portions of the nasopharynx,                            oropharynx and larynx were normal.                           The examined esophagus was normal.                           The entire examined stomach was normal. Biopsies                            were taken with a cold forceps for Helicobacter                            pylori testing. Estimated blood loss was minimal.                           The examined duodenum was normal. Complications:            No immediate complications. Estimated Blood Loss:     Estimated blood loss was minimal. Impression:               - The examined portions of the nasopharynx,                            oropharynx and larynx were normal.                           - Normal esophagus.                           - Normal  stomach. Biopsied.                           - Normal examined duodenum.                           - No endoscopic abnormalities to explain abdominal                            pain Recommendation:           - Patient has a contact number available for                            emergencies. The signs and symptoms of potential                            delayed complications were discussed with the                            patient. Return to normal activities tomorrow.                            Written discharge instructions were provided to the                            patient.                           - Resume previous diet.                           - Continue present medications.                           - Await pathology results.                           - Follow up as needed in clinic for ongoing                            management of chronic abdominal pain. Wellington Winegarden E. Candis Schatz, MD 01/14/2022 2:15:05 PM This report has been signed  electronically.

## 2022-01-15 ENCOUNTER — Telehealth: Payer: Self-pay | Admitting: *Deleted

## 2022-01-15 NOTE — Telephone Encounter (Signed)
Attempt for follow up phone call. No answer at number given.  Left message on voicemail.   

## 2022-01-16 DIAGNOSIS — H25812 Combined forms of age-related cataract, left eye: Secondary | ICD-10-CM | POA: Diagnosis not present

## 2022-01-22 NOTE — Progress Notes (Signed)
Ms. Rayl, The biopsies taken from your stomach were notable for mild chronic gastritis (inflammation) which is a common finding, but there was no evidence of Helicobacter pylori infection. This common finding is not likely to explain abdominal pain and there is no specific treatment or further evaluation recommended.  Please follow-up with me in clinic as needed for ongoing management of your chronic abdominal pain

## 2022-01-29 DIAGNOSIS — Z961 Presence of intraocular lens: Secondary | ICD-10-CM | POA: Diagnosis not present

## 2022-01-29 DIAGNOSIS — H2512 Age-related nuclear cataract, left eye: Secondary | ICD-10-CM | POA: Diagnosis not present

## 2022-01-29 DIAGNOSIS — H25812 Combined forms of age-related cataract, left eye: Secondary | ICD-10-CM | POA: Diagnosis not present

## 2022-05-07 DIAGNOSIS — E782 Mixed hyperlipidemia: Secondary | ICD-10-CM | POA: Diagnosis not present

## 2022-05-07 DIAGNOSIS — I10 Essential (primary) hypertension: Secondary | ICD-10-CM | POA: Diagnosis not present

## 2022-05-07 DIAGNOSIS — E559 Vitamin D deficiency, unspecified: Secondary | ICD-10-CM | POA: Diagnosis not present

## 2022-05-07 DIAGNOSIS — Z0001 Encounter for general adult medical examination with abnormal findings: Secondary | ICD-10-CM | POA: Diagnosis not present

## 2022-05-07 DIAGNOSIS — E119 Type 2 diabetes mellitus without complications: Secondary | ICD-10-CM | POA: Diagnosis not present

## 2022-05-07 DIAGNOSIS — K5904 Chronic idiopathic constipation: Secondary | ICD-10-CM | POA: Diagnosis not present

## 2022-05-11 DIAGNOSIS — E559 Vitamin D deficiency, unspecified: Secondary | ICD-10-CM | POA: Diagnosis not present

## 2022-05-11 DIAGNOSIS — Z1231 Encounter for screening mammogram for malignant neoplasm of breast: Secondary | ICD-10-CM | POA: Diagnosis not present

## 2022-05-11 DIAGNOSIS — D72818 Other decreased white blood cell count: Secondary | ICD-10-CM | POA: Diagnosis not present

## 2022-07-14 DIAGNOSIS — Z961 Presence of intraocular lens: Secondary | ICD-10-CM | POA: Diagnosis not present

## 2022-07-14 DIAGNOSIS — H40003 Preglaucoma, unspecified, bilateral: Secondary | ICD-10-CM | POA: Diagnosis not present

## 2022-08-27 DIAGNOSIS — Z1211 Encounter for screening for malignant neoplasm of colon: Secondary | ICD-10-CM | POA: Diagnosis not present

## 2022-08-27 DIAGNOSIS — G8929 Other chronic pain: Secondary | ICD-10-CM | POA: Diagnosis not present

## 2022-08-27 DIAGNOSIS — R1032 Left lower quadrant pain: Secondary | ICD-10-CM | POA: Diagnosis not present

## 2022-09-15 DIAGNOSIS — Z1211 Encounter for screening for malignant neoplasm of colon: Secondary | ICD-10-CM | POA: Diagnosis not present

## 2022-10-02 IMAGING — CT CT ABD-PELV W/ CM
2 of 5 series · 16 of 46 positions shown, 18 images · IV contrast (Omnipaque)
Comparison: None Available.

CLINICAL DATA: Left lower quadrant pain and constipation for 6
months.

EXAM:
CT ABDOMEN AND PELVIS WITH CONTRAST
TECHNIQUE: Multidetector CT imaging of the abdomen and pelvis was performed
using the standard protocol following bolus administration of
intravenous contrast.

[Series 2: axial st · axial · 0.83mm/px · z∈[-465,-95]mm · 13 of 84 slices shown, 15 images]
[im 5/84  soft-tissue]
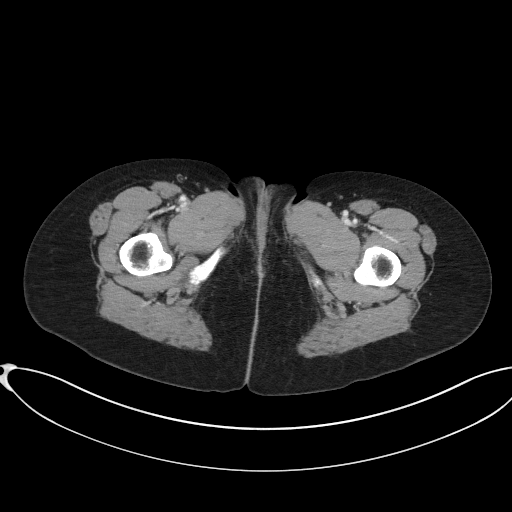
[im 5/84  bone]
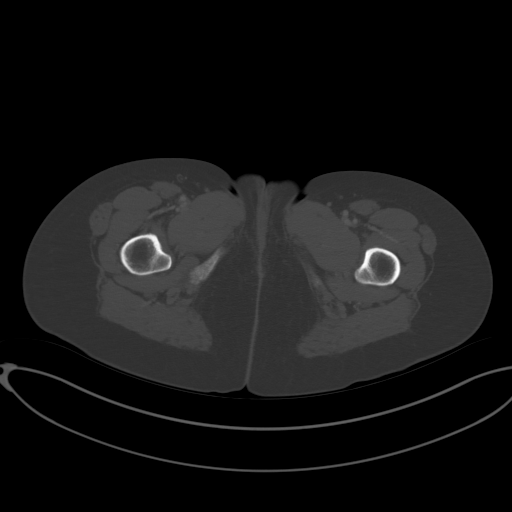
[im 13/84  soft-tissue]
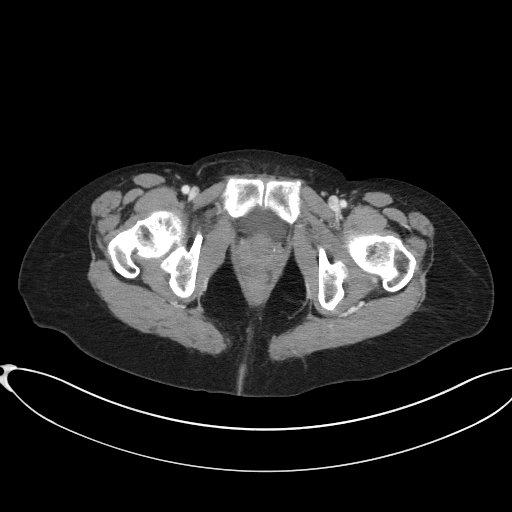
[im 17/84  soft-tissue]
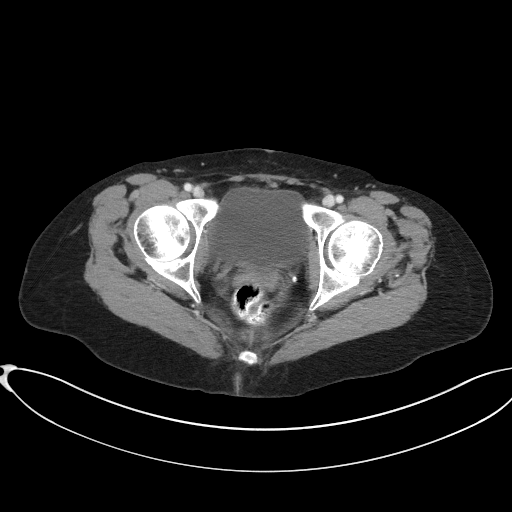
[im 25/84  soft-tissue]
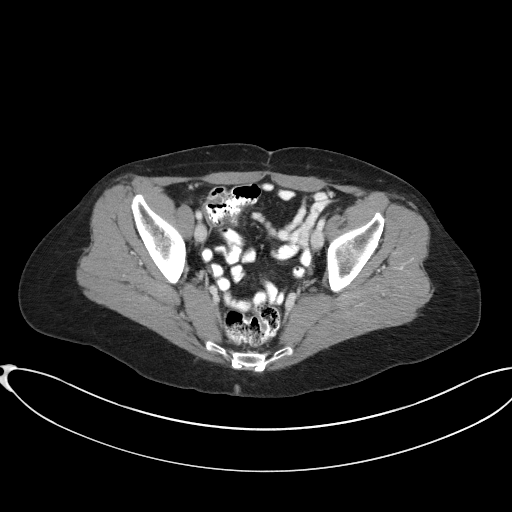
[im 30/84  soft-tissue]
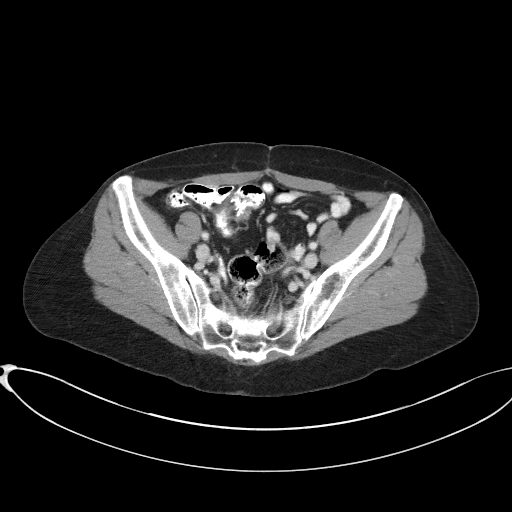
[im 38/84  soft-tissue]
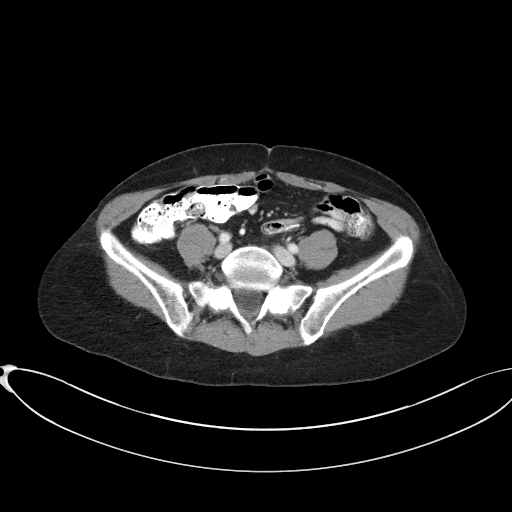
[im 42/84  soft-tissue]
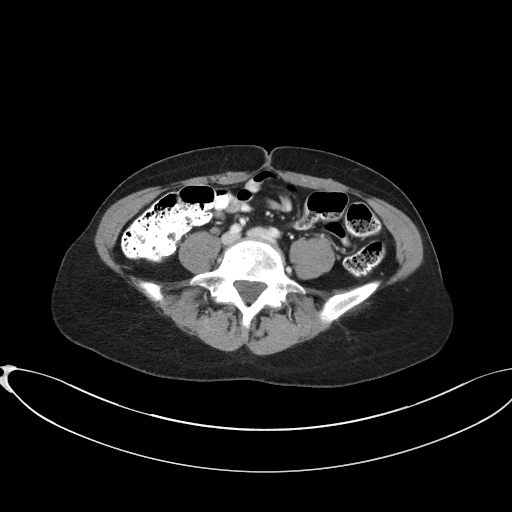
[im 46/84  soft-tissue]
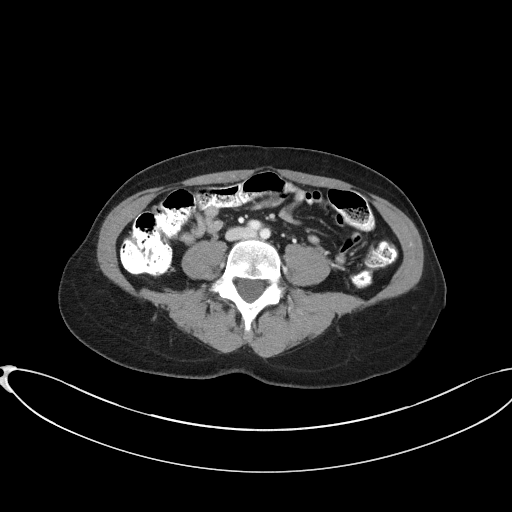
[im 54/84  soft-tissue]
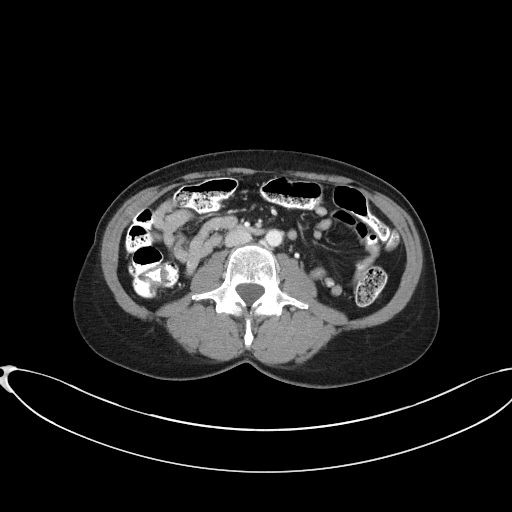
[im 54/84  bone]
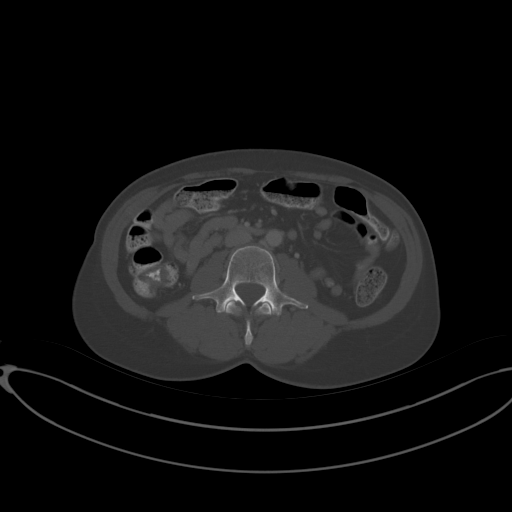
[im 59/84  soft-tissue]
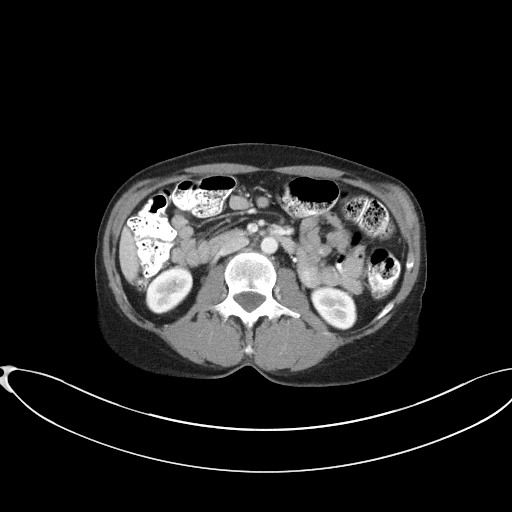
[im 67/84  soft-tissue]
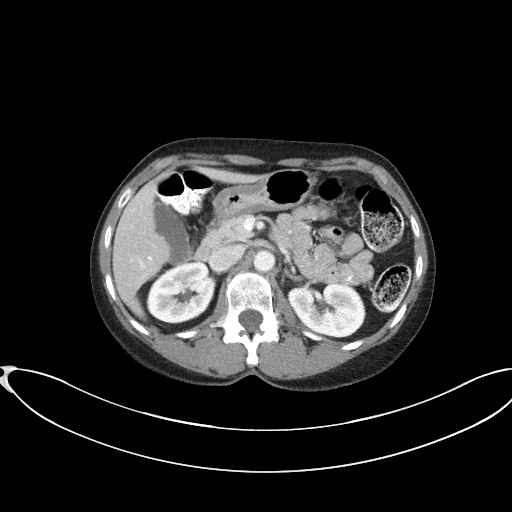
[im 71/84  soft-tissue]
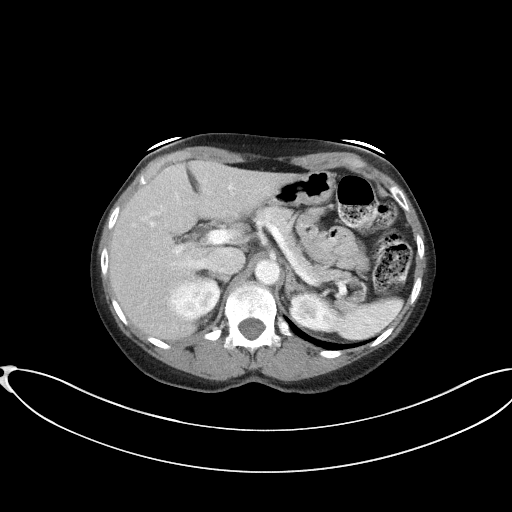
[im 79/84  soft-tissue]
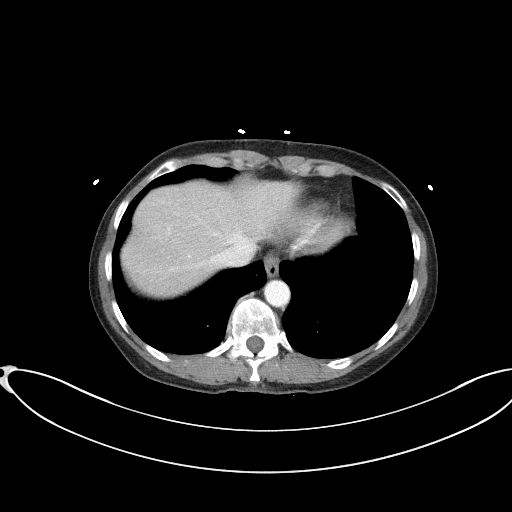

[Series 5: coronal st · coronal · 0.72mm/px · 3 of 75 slices shown]
[im 25/75  soft-tissue]
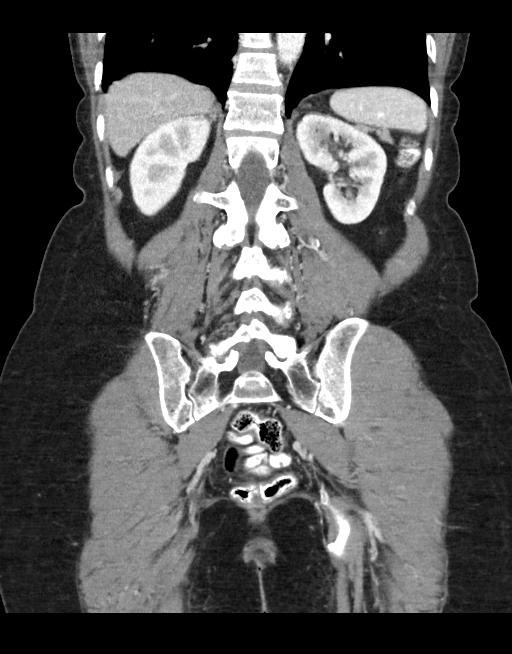
[im 33/75  soft-tissue]
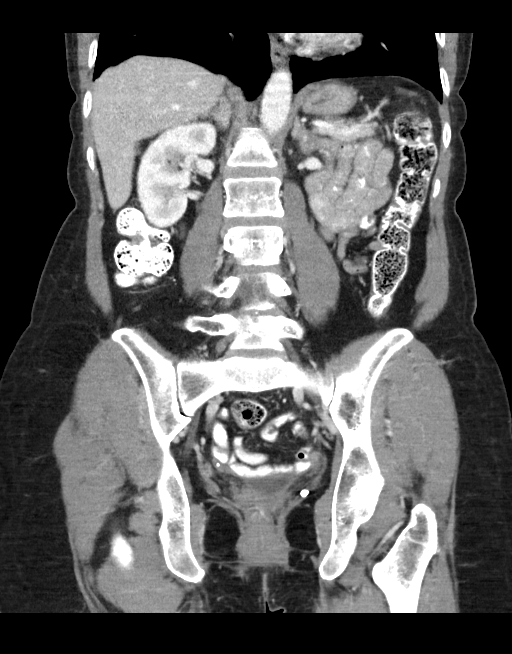
[im 42/75  soft-tissue]
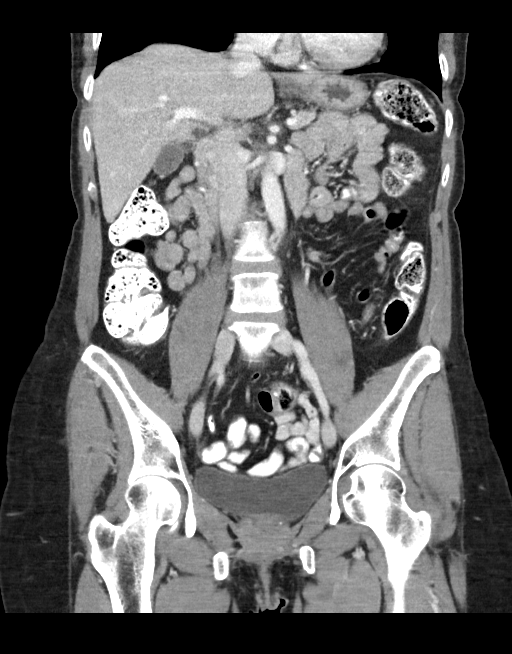

[16 of 46 positions shown; findings below may reference images not displayed]

RADIATION DOSE REDUCTION: This exam was performed according to the
departmental dose-optimization program which includes automated
exposure control, adjustment of the mA and/or kV according to
patient size and/or use of iterative reconstruction technique.

CONTRAST:  100mL OMNIPAQUE IOHEXOL 300 MG/ML  SOLN
FINDINGS: Lower Chest: No acute findings.

Hepatobiliary: No hepatic masses identified. Gallbladder is
unremarkable. No evidence of biliary ductal dilatation.

Pancreas:  No mass or inflammatory changes.

Spleen: Within normal limits in size and appearance.

Adrenals/Urinary Tract: No masses identified. No evidence of
ureteral calculi or hydronephrosis.

Stomach/Bowel: No evidence of obstruction, inflammatory process or
abnormal fluid collections. Normal appendix visualized.

Vascular/Lymphatic: No pathologically enlarged lymph nodes. No acute
vascular findings.

Reproductive: Prior hysterectomy noted. Adnexal regions are
unremarkable in appearance.

Other:  None.

Musculoskeletal:  No suspicious bone lesions identified.
IMPRESSION: Negative. No acute findings or other significant abnormality.

## 2022-10-19 DIAGNOSIS — Z136 Encounter for screening for cardiovascular disorders: Secondary | ICD-10-CM | POA: Diagnosis not present

## 2022-10-19 DIAGNOSIS — E119 Type 2 diabetes mellitus without complications: Secondary | ICD-10-CM | POA: Diagnosis not present

## 2022-10-19 DIAGNOSIS — E782 Mixed hyperlipidemia: Secondary | ICD-10-CM | POA: Diagnosis not present

## 2022-10-19 DIAGNOSIS — G8929 Other chronic pain: Secondary | ICD-10-CM | POA: Diagnosis not present

## 2022-10-19 DIAGNOSIS — K5904 Chronic idiopathic constipation: Secondary | ICD-10-CM | POA: Diagnosis not present

## 2022-10-19 DIAGNOSIS — I10 Essential (primary) hypertension: Secondary | ICD-10-CM | POA: Diagnosis not present

## 2022-10-19 DIAGNOSIS — Z0001 Encounter for general adult medical examination with abnormal findings: Secondary | ICD-10-CM | POA: Diagnosis not present

## 2022-10-19 DIAGNOSIS — R109 Unspecified abdominal pain: Secondary | ICD-10-CM | POA: Diagnosis not present

## 2022-10-19 DIAGNOSIS — E559 Vitamin D deficiency, unspecified: Secondary | ICD-10-CM | POA: Diagnosis not present

## 2022-12-28 DIAGNOSIS — E559 Vitamin D deficiency, unspecified: Secondary | ICD-10-CM | POA: Diagnosis not present

## 2022-12-28 DIAGNOSIS — I10 Essential (primary) hypertension: Secondary | ICD-10-CM | POA: Diagnosis not present

## 2022-12-28 DIAGNOSIS — E782 Mixed hyperlipidemia: Secondary | ICD-10-CM | POA: Diagnosis not present

## 2022-12-28 DIAGNOSIS — K5904 Chronic idiopathic constipation: Secondary | ICD-10-CM | POA: Diagnosis not present

## 2022-12-28 DIAGNOSIS — E119 Type 2 diabetes mellitus without complications: Secondary | ICD-10-CM | POA: Diagnosis not present

## 2023-01-13 DIAGNOSIS — Z1211 Encounter for screening for malignant neoplasm of colon: Secondary | ICD-10-CM | POA: Diagnosis not present

## 2023-01-13 DIAGNOSIS — G8929 Other chronic pain: Secondary | ICD-10-CM | POA: Diagnosis not present

## 2023-01-13 DIAGNOSIS — R1032 Left lower quadrant pain: Secondary | ICD-10-CM | POA: Diagnosis not present

## 2023-05-17 DIAGNOSIS — I10 Essential (primary) hypertension: Secondary | ICD-10-CM | POA: Diagnosis not present

## 2023-05-17 DIAGNOSIS — E559 Vitamin D deficiency, unspecified: Secondary | ICD-10-CM | POA: Diagnosis not present

## 2023-05-17 DIAGNOSIS — E782 Mixed hyperlipidemia: Secondary | ICD-10-CM | POA: Diagnosis not present

## 2023-05-17 DIAGNOSIS — K5904 Chronic idiopathic constipation: Secondary | ICD-10-CM | POA: Diagnosis not present

## 2023-05-17 DIAGNOSIS — E119 Type 2 diabetes mellitus without complications: Secondary | ICD-10-CM | POA: Diagnosis not present

## 2023-05-17 DIAGNOSIS — Z23 Encounter for immunization: Secondary | ICD-10-CM | POA: Diagnosis not present

## 2023-06-02 DIAGNOSIS — I10 Essential (primary) hypertension: Secondary | ICD-10-CM | POA: Diagnosis not present

## 2023-06-02 DIAGNOSIS — E559 Vitamin D deficiency, unspecified: Secondary | ICD-10-CM | POA: Diagnosis not present

## 2023-06-02 DIAGNOSIS — E119 Type 2 diabetes mellitus without complications: Secondary | ICD-10-CM | POA: Diagnosis not present

## 2023-06-02 DIAGNOSIS — E782 Mixed hyperlipidemia: Secondary | ICD-10-CM | POA: Diagnosis not present

## 2023-06-02 DIAGNOSIS — K5904 Chronic idiopathic constipation: Secondary | ICD-10-CM | POA: Diagnosis not present

## 2023-06-07 DIAGNOSIS — Z1231 Encounter for screening mammogram for malignant neoplasm of breast: Secondary | ICD-10-CM | POA: Diagnosis not present

## 2023-06-14 DIAGNOSIS — K5904 Chronic idiopathic constipation: Secondary | ICD-10-CM | POA: Diagnosis not present

## 2023-06-14 DIAGNOSIS — I10 Essential (primary) hypertension: Secondary | ICD-10-CM | POA: Diagnosis not present

## 2023-06-14 DIAGNOSIS — E559 Vitamin D deficiency, unspecified: Secondary | ICD-10-CM | POA: Diagnosis not present

## 2023-06-14 DIAGNOSIS — E782 Mixed hyperlipidemia: Secondary | ICD-10-CM | POA: Diagnosis not present

## 2023-06-14 DIAGNOSIS — E119 Type 2 diabetes mellitus without complications: Secondary | ICD-10-CM | POA: Diagnosis not present

## 2023-06-16 DIAGNOSIS — R1032 Left lower quadrant pain: Secondary | ICD-10-CM | POA: Diagnosis not present

## 2023-06-16 DIAGNOSIS — G8929 Other chronic pain: Secondary | ICD-10-CM | POA: Diagnosis not present

## 2023-12-08 DIAGNOSIS — E782 Mixed hyperlipidemia: Secondary | ICD-10-CM | POA: Diagnosis not present

## 2023-12-08 DIAGNOSIS — K5904 Chronic idiopathic constipation: Secondary | ICD-10-CM | POA: Diagnosis not present

## 2023-12-08 DIAGNOSIS — I10 Essential (primary) hypertension: Secondary | ICD-10-CM | POA: Diagnosis not present

## 2023-12-08 DIAGNOSIS — E119 Type 2 diabetes mellitus without complications: Secondary | ICD-10-CM | POA: Diagnosis not present

## 2023-12-08 DIAGNOSIS — E559 Vitamin D deficiency, unspecified: Secondary | ICD-10-CM | POA: Diagnosis not present

## 2023-12-09 DIAGNOSIS — E559 Vitamin D deficiency, unspecified: Secondary | ICD-10-CM | POA: Diagnosis not present

## 2023-12-09 DIAGNOSIS — E782 Mixed hyperlipidemia: Secondary | ICD-10-CM | POA: Diagnosis not present

## 2023-12-09 DIAGNOSIS — I10 Essential (primary) hypertension: Secondary | ICD-10-CM | POA: Diagnosis not present

## 2023-12-09 DIAGNOSIS — E119 Type 2 diabetes mellitus without complications: Secondary | ICD-10-CM | POA: Diagnosis not present

## 2023-12-09 DIAGNOSIS — Z0001 Encounter for general adult medical examination with abnormal findings: Secondary | ICD-10-CM | POA: Diagnosis not present

## 2023-12-09 DIAGNOSIS — K5904 Chronic idiopathic constipation: Secondary | ICD-10-CM | POA: Diagnosis not present

## 2024-04-24 DIAGNOSIS — E559 Vitamin D deficiency, unspecified: Secondary | ICD-10-CM | POA: Diagnosis not present

## 2024-04-24 DIAGNOSIS — E119 Type 2 diabetes mellitus without complications: Secondary | ICD-10-CM | POA: Diagnosis not present

## 2024-04-24 DIAGNOSIS — K5904 Chronic idiopathic constipation: Secondary | ICD-10-CM | POA: Diagnosis not present

## 2024-04-24 DIAGNOSIS — Z0001 Encounter for general adult medical examination with abnormal findings: Secondary | ICD-10-CM | POA: Diagnosis not present

## 2024-04-24 DIAGNOSIS — E782 Mixed hyperlipidemia: Secondary | ICD-10-CM | POA: Diagnosis not present

## 2024-04-24 DIAGNOSIS — I10 Essential (primary) hypertension: Secondary | ICD-10-CM | POA: Diagnosis not present

## 2024-05-09 DIAGNOSIS — G5601 Carpal tunnel syndrome, right upper limb: Secondary | ICD-10-CM | POA: Diagnosis not present

## 2024-05-09 DIAGNOSIS — K5909 Other constipation: Secondary | ICD-10-CM | POA: Diagnosis not present
